# Patient Record
Sex: Male | Born: 1945
Health system: Southern US, Community
[De-identification: ages and names within clinical notes are randomized; demographics above are authoritative.]

## PROBLEM LIST (undated history)

## (undated) DIAGNOSIS — C801 Malignant (primary) neoplasm, unspecified: Secondary | ICD-10-CM

## (undated) DIAGNOSIS — I10 Essential (primary) hypertension: Secondary | ICD-10-CM

## (undated) HISTORY — PX: PROSTATE SURGERY: SHX751

---

## 2013-07-24 DIAGNOSIS — C61 Malignant neoplasm of prostate: Secondary | ICD-10-CM | POA: Insufficient documentation

## 2015-06-25 ENCOUNTER — Emergency Department (INDEPENDENT_AMBULATORY_CARE_PROVIDER_SITE_OTHER)
Admission: EM | Admit: 2015-06-25 | Discharge: 2015-06-25 | Disposition: A | Discharge status: Home / Self Care | Payer: Medicare Other | Source: Home / Self Care | Attending: Family Medicine | Admitting: Family Medicine

## 2015-06-25 DIAGNOSIS — J209 Acute bronchitis, unspecified: Secondary | ICD-10-CM

## 2015-06-25 MED ORDER — IPRATROPIUM-ALBUTEROL 0.5-2.5 (3) MG/3ML IN SOLN
RESPIRATORY_TRACT | Status: AC
  Filled 2015-06-25: qty 3

## 2015-06-25 MED ORDER — IPRATROPIUM-ALBUTEROL 0.5-2.5 (3) MG/3ML IN SOLN
3.0000 mL | Freq: Once | RESPIRATORY_TRACT | Status: AC
  Administered 2015-06-25: 3 mL via RESPIRATORY_TRACT

## 2015-06-25 MED ORDER — ALBUTEROL SULFATE HFA 108 (90 BASE) MCG/ACT IN AERS: 2.0000 | INHALATION_SPRAY | RESPIRATORY_TRACT | Status: DC | PRN

## 2015-06-25 MED ORDER — AZITHROMYCIN 250 MG PO TABS: 250.0000 mg | ORAL_TABLET | Freq: Every day | ORAL | Status: DC

## 2015-06-25 MED ORDER — PREDNISONE 50 MG PO TABS: ORAL_TABLET | ORAL | Status: DC

## 2015-06-25 NOTE — ED Provider Notes (Signed)
CSN: [id number]     Arrival date & time 06/25/15  1851 History   First MD Initiated Contact with Patient 06/25/15 1949     Chief Complaint  Patient presents with  . Cough   (Consider location/radiation/quality/duration/timing/severity/associated sxs/prior Treatment) HPI Cough, wheezing for the last couple of days. Non smoker. OTC meds without relief of symptoms. Relocating from [facility]  No past medical history on file. No past surgical history on file. No family history on file. Social History  Substance Use Topics  . Smoking status: Not on file  . Smokeless tobacco: Not on file  . Alcohol Use: Not on file    Review of Systems ROS +'vecough, wheezing  Denies: HEADACHE, NAUSEA, ABDOMINAL PAIN, CHEST PAIN, CONGESTION, DYSURIA, SHORTNESS OF BREATH  Allergies  Review of patient's allergies indicates no known allergies.  Home Medications   Prior to Admission medications   Medication Sig Start Date End Date Taking? Authorizing Provider  albuterol (PROVENTIL HFA;VENTOLIN HFA) 108 (90 Base) MCG/ACT inhaler Inhale 2 puffs into the lungs every 4 (four) hours as needed for wheezing or shortness of breath. 06/25/15   Konrad Felix, PA  azithromycin (ZITHROMAX) 250 MG tablet Take 1 tablet (250 mg total) by mouth daily. Take first 2 tablets together, then 1 every day until finished. 06/25/15   Konrad Felix, PA  predniSONE (DELTASONE) 50 MG tablet 1 tablet daily for 5 days 06/25/15   Konrad Felix, PA   Meds Ordered and Administered this Visit   Medications  ipratropium-albuterol (DUONEB) 0.5-2.5 (3) MG/3ML nebulizer solution 3 mL (3 mLs Nebulization Given 06/25/15 2023)    BP 195/83 mmHg  Pulse 61  Temp(Src) 98.3 F (36.8 C) (Oral)  Resp 16  SpO2 99% No data found.   Physical Exam  Constitutional: He is oriented to person, place, and time. He appears well-developed and well-nourished.  Eyes: Conjunctivae are normal.  Neck: Normal range of motion. Neck supple.   Pulmonary/Chest: Effort normal. No respiratory distress. He has wheezes. He exhibits no tenderness.  Speaks in full sentences.  No consolidation  Abdominal: Soft.  Musculoskeletal: Normal range of motion.  Neurological: He is oriented to person, place, and time.  Skin: Skin is warm and dry.  Psychiatric: He has a normal mood and affect. His behavior is normal. Judgment and thought content normal.  Nursing note and vitals reviewed.   ED Course  Procedures (including critical care time)  Labs Review Labs Reviewed - No data to display  Imaging Review No results found.   Visual Acuity Review  Right Eye Distance:   Left Eye Distance:   Bilateral Distance:    Right Eye Near:   Left Eye Near:    Bilateral Near:        Reviewed after neb treatment, and pt states his symptoms are much improved.  MDM   1. Bronchitis, acute, with bronchospasm    Patient is advised to continue home symptomatic treatment. Prescription for zpak, albuterol, prednisone  sent pharmacy patient has indicated. Patient is advised that if there are new or worsening symptoms or attend the emergency department, or contact primary care provider. Instructions of care provided discharged home in stable condition.  THIS NOTE WAS GENERATED USING A VOICE RECOGNITION SOFTWARE PROGRAM. ALL REASONABLE EFFORTS  WERE MADE TO PROOFREAD THIS DOCUMENT FOR ACCURACY.     Konrad Felix, PA 06/26/15 1310

## 2015-06-25 NOTE — Discharge Instructions (Signed)

## 2015-06-25 NOTE — ED Notes (Signed)
Patient complains of having a cough with yellow phlegm for the past week Denies any fever

## 2015-07-15 ENCOUNTER — Encounter (HOSPITAL_COMMUNITY): Payer: Self-pay | Admitting: *Deleted

## 2015-07-15 ENCOUNTER — Emergency Department (INDEPENDENT_AMBULATORY_CARE_PROVIDER_SITE_OTHER): Payer: Medicare Other

## 2015-07-15 ENCOUNTER — Encounter (HOSPITAL_COMMUNITY): Payer: Self-pay | Admitting: Emergency Medicine

## 2015-07-15 ENCOUNTER — Emergency Department (HOSPITAL_COMMUNITY)
Admission: EM | Admit: 2015-07-15 | Discharge: 2015-07-15 | Disposition: A | Discharge status: Home / Self Care | Payer: Medicare Other | Attending: Emergency Medicine | Admitting: Emergency Medicine

## 2015-07-15 ENCOUNTER — Emergency Department (INDEPENDENT_AMBULATORY_CARE_PROVIDER_SITE_OTHER)
Admission: EM | Admit: 2015-07-15 | Discharge: 2015-07-15 | Disposition: A | Discharge status: Home / Self Care | Payer: Medicare Other | Source: Home / Self Care | Attending: Family Medicine | Admitting: Family Medicine

## 2015-07-15 DIAGNOSIS — R062 Wheezing: Secondary | ICD-10-CM | POA: Diagnosis not present

## 2015-07-15 DIAGNOSIS — R0602 Shortness of breath: Secondary | ICD-10-CM | POA: Diagnosis not present

## 2015-07-15 DIAGNOSIS — Z79899 Other long term (current) drug therapy: Secondary | ICD-10-CM | POA: Insufficient documentation

## 2015-07-15 DIAGNOSIS — J4521 Mild intermittent asthma with (acute) exacerbation: Secondary | ICD-10-CM

## 2015-07-15 DIAGNOSIS — R05 Cough: Secondary | ICD-10-CM | POA: Insufficient documentation

## 2015-07-15 DIAGNOSIS — R0981 Nasal congestion: Secondary | ICD-10-CM | POA: Diagnosis present

## 2015-07-15 MED ORDER — DEXAMETHASONE SODIUM PHOSPHATE 10 MG/ML IJ SOLN
10.0000 mg | Freq: Once | INTRAMUSCULAR | Status: AC
  Administered 2015-07-15: 10 mg via INTRAMUSCULAR
  Filled 2015-07-15: qty 1

## 2015-07-15 MED ORDER — IPRATROPIUM BROMIDE 0.02 % IN SOLN
0.5000 mg | Freq: Once | RESPIRATORY_TRACT | Status: AC
  Administered 2015-07-15: 0.5 mg via RESPIRATORY_TRACT
  Filled 2015-07-15: qty 2.5

## 2015-07-15 MED ORDER — DOXYCYCLINE HYCLATE 100 MG PO CAPS: 100.0000 mg | ORAL_CAPSULE | Freq: Two times a day (BID) | ORAL | Status: DC

## 2015-07-15 MED ORDER — ALBUTEROL SULFATE (2.5 MG/3ML) 0.083% IN NEBU
5.0000 mg | INHALATION_SOLUTION | Freq: Once | RESPIRATORY_TRACT | Status: AC
  Administered 2015-07-15: 5 mg via RESPIRATORY_TRACT
  Filled 2015-07-15: qty 6

## 2015-07-15 NOTE — ED Notes (Signed)
Given meal bag with cranberry juice.

## 2015-07-15 NOTE — ED Provider Notes (Signed)
CSN: [id number]     Arrival date & time 07/15/15  1422 History   First MD Initiated Contact with Patient 07/15/15 1548     Chief Complaint  Patient presents with  . Follow-up   (Consider location/radiation/quality/duration/timing/severity/associated sxs/prior Treatment) Patient is a 70 y.o. male presenting with cough. The history is provided by the patient.  Cough Cough characteristics:  Productive Sputum characteristics:  White Severity:  Moderate Duration:  4 weeks Timing:  Constant Progression:  Waxing and waning Chronicity:  Recurrent (seen 1/12 with asthma and sx improved briefly after care but relapsed.) Smoker: no   Associated symptoms: shortness of breath and wheezing     History reviewed. No pertinent past medical history. History reviewed. No pertinent past surgical history. History reviewed. No pertinent family history. Social History  Substance Use Topics  . Smoking status: Never Smoker   . Smokeless tobacco: None  . Alcohol Use: No    Review of Systems  Constitutional: Negative.   HENT: Negative.   Respiratory: Positive for cough, shortness of breath and wheezing.   Cardiovascular: Negative.   All other systems reviewed and are negative.   Allergies  Review of patient's allergies indicates no known allergies.  Home Medications   Prior to Admission medications   Medication Sig Start Date End Date Taking? Authorizing Provider  albuterol (PROVENTIL HFA;VENTOLIN HFA) 108 (90 Base) MCG/ACT inhaler Inhale 2 puffs into the lungs every 4 (four) hours as needed for wheezing or shortness of breath. 06/25/15   Konrad Felix, PA  azithromycin (ZITHROMAX) 250 MG tablet Take 1 tablet (250 mg total) by mouth daily. Take first 2 tablets together, then 1 every day until finished. 06/25/15   Konrad Felix, PA  predniSONE (DELTASONE) 50 MG tablet 1 tablet daily for 5 days 06/25/15   Konrad Felix, PA   Meds Ordered and Administered this Visit  Medications - No data to  display  BP 187/76 mmHg  Pulse 60  Temp(Src) 98.2 F (36.8 C) (Oral)  Resp 16  SpO2 96% No data found.   Physical Exam  Constitutional: He is oriented to person, place, and time. He appears well-developed and well-nourished. No distress.  HENT:  Mouth/Throat: Oropharynx is clear and moist.  Neck: Normal range of motion. Neck supple.  Cardiovascular: Normal rate, regular rhythm, normal heart sounds and intact distal pulses.   Pulmonary/Chest: Effort normal. He has wheezes.  Lymphadenopathy:    He has no cervical adenopathy.  Neurological: He is alert and oriented to person, place, and time.  Skin: Skin is warm and dry.  Nursing note and vitals reviewed.   ED Course  Procedures (including critical care time)  Labs Review Labs Reviewed - No data to display  Imaging Review Dg Chest 2 View  07/15/2015  CLINICAL DATA:  Shortness of breath and wheezing EXAM: CHEST  2 VIEW COMPARISON:  None. FINDINGS: Top-normal heart size. Mildly tortuous thoracic aorta. Otherwise normal mediastinal contour. No pneumothorax. No pleural effusion. Lungs appear clear, with no acute consolidative airspace disease and no pulmonary edema. IMPRESSION: No active cardiopulmonary disease. Electronically Signed   By: Ilona Sorrel M.D.   On: 07/15/2015 16:28   X-rays reviewed and report per radiologist.   Visual Acuity Review  Right Eye Distance:   Left Eye Distance:   Bilateral Distance:    Right Eye Near:   Left Eye Near:    Bilateral Near:         MDM   1. Asthma exacerbation attacks, mild intermittent  Sent for recurrent asthma eval., , seen 1/12 and treated with temp relief but now wheezing with doe.    Billy Fischer, MD 07/15/15 [phone]

## 2015-07-15 NOTE — ED Notes (Signed)
Pt  Was  Seen  2 1/2  Weeks  Ago  For  Bronchitis           Took  meds     Still  Having         Symptoms    Cough   And   Congestion           And  Tightness  In  Chest           With   Wheezing  Noted

## 2015-07-15 NOTE — Discharge Instructions (Signed)

## 2015-07-15 NOTE — ED Notes (Signed)
pefr    450

## 2015-07-15 NOTE — ED Notes (Signed)
Security called to transport pt back to car at [facility]

## 2015-07-15 NOTE — ED Notes (Signed)
Pt from home for follow up on congestion and cough with yellow sputum, states was seen at [facility] for same and given zpack but reports after he finished it didn't help. Denies any n/v/d or fevers. No respiratory distress noted at this time.

## 2015-07-15 NOTE — ED Notes (Signed)
Pt c/o congestion and wheezing. Was seen on 1/12 at [facility], given inhaler, and antibiotics. Reports improvement with antibiotics, has not been using inhaler. Wheezing noted bilateral

## 2015-07-19 NOTE — ED Provider Notes (Signed)
CSN: [id number]     Arrival date & time 07/15/15  1719 History   First MD Initiated Contact with Patient 07/15/15 1908     Chief Complaint  Patient presents with  . Nasal Congestion      HPI Pt from home for follow up on congestion and cough with yellow sputum, states was seen at [facility] for same and given zpack but reports after he finished it didn't help. Denies any n/v/d or fevers. No respiratory distress noted at this time.  History reviewed. No pertinent past medical history. History reviewed. No pertinent past surgical history. No family history on file. Social History  Substance Use Topics  . Smoking status: Never Smoker   . Smokeless tobacco: None  . Alcohol Use: No    Review of Systems  All other systems reviewed and are negative.     Allergies  Review of patient's allergies indicates no known allergies.  Home Medications   Prior to Admission medications   Medication Sig Start Date End Date Taking? Authorizing Provider  albuterol (PROVENTIL HFA;VENTOLIN HFA) 108 (90 Base) MCG/ACT inhaler Inhale 2 puffs into the lungs every 4 (four) hours as needed for wheezing or shortness of breath. 06/25/15  Yes Konrad Felix, PA  metoprolol (TOPROL-XL) 200 MG 24 hr tablet Take 200 mg by mouth daily.   Yes Historical Provider, MD  valsartan (DIOVAN) 320 MG tablet Take 320 mg by mouth daily.   Yes Historical Provider, MD  Verapamil HCl CR 200 MG CP24 Take 1 capsule by mouth daily.   Yes Historical Provider, MD  doxycycline (VIBRAMYCIN) 100 MG capsule Take 1 capsule (100 mg total) by mouth 2 (two) times daily. 07/15/15   Leonard Schwartz, MD  predniSONE (DELTASONE) 50 MG tablet 1 tablet daily for 5 days Patient not taking: Reported on 07/15/2015 06/25/15   Konrad Felix, PA   BP 162/83 mmHg  Pulse 62  Temp(Src) 98.1 F (36.7 C) (Oral)  Resp 18  SpO2 98% Physical Exam  Constitutional: He is oriented to person, place, and time. He appears well-developed and well-nourished. No distress.   HENT:  Head: Normocephalic and atraumatic.  Eyes: Pupils are equal, round, and reactive to light.  Neck: Normal range of motion.  Cardiovascular: Normal rate and intact distal pulses.   Pulmonary/Chest: No respiratory distress. He has wheezes.  Abdominal: Normal appearance. He exhibits no distension. There is no tenderness. There is no rebound.  Musculoskeletal: Normal range of motion.  Neurological: He is alert and oriented to person, place, and time. No cranial nerve deficit.  Skin: Skin is warm and dry. No rash noted.  Psychiatric: He has a normal mood and affect. His behavior is normal.  Nursing note and vitals reviewed.   ED Course  Procedures (including critical care time) Medications  albuterol (PROVENTIL) (2.5 MG/3ML) 0.083% nebulizer solution 5 mg (5 mg Nebulization Given 07/15/15 2003)  ipratropium (ATROVENT) nebulizer solution 0.5 mg (0.5 mg Nebulization Given 07/15/15 2003)  dexamethasone (DECADRON) injection 10 mg (10 mg Intramuscular Given 07/15/15 2107)     No results found for this or any previous visit. Dg Chest 2 View  07/15/2015  CLINICAL DATA:  Shortness of breath and wheezing EXAM: CHEST  2 VIEW COMPARISON:  None. FINDINGS: Top-normal heart size. Mildly tortuous thoracic aorta. Otherwise normal mediastinal contour. No pneumothorax. No pleural effusion. Lungs appear clear, with no acute consolidative airspace disease and no pulmonary edema. IMPRESSION: No active cardiopulmonary disease. Electronically Signed   By: Janina Mayo.D.  On: 07/15/2015 16:28      After treatment in the ED the patient feels back to baseline and wants to go home.  MDM   Final diagnoses:  Wheezing        Leonard Schwartz, MD 07/19/15 1036

## 2015-10-16 DIAGNOSIS — H11153 Pinguecula, bilateral: Secondary | ICD-10-CM | POA: Diagnosis not present

## 2015-10-16 DIAGNOSIS — H2511 Age-related nuclear cataract, right eye: Secondary | ICD-10-CM | POA: Diagnosis not present

## 2015-10-16 DIAGNOSIS — H43812 Vitreous degeneration, left eye: Secondary | ICD-10-CM | POA: Diagnosis not present

## 2015-11-19 DIAGNOSIS — Z8546 Personal history of malignant neoplasm of prostate: Secondary | ICD-10-CM | POA: Diagnosis not present

## 2015-11-19 DIAGNOSIS — I10 Essential (primary) hypertension: Secondary | ICD-10-CM | POA: Diagnosis not present

## 2015-11-19 DIAGNOSIS — Z23 Encounter for immunization: Secondary | ICD-10-CM | POA: Diagnosis not present

## 2015-11-19 DIAGNOSIS — Z0000 Encounter for general adult medical examination without abnormal findings: Secondary | ICD-10-CM | POA: Diagnosis not present

## 2015-11-19 DIAGNOSIS — Z125 Encounter for screening for malignant neoplasm of prostate: Secondary | ICD-10-CM | POA: Diagnosis not present

## 2015-11-19 DIAGNOSIS — Z136 Encounter for screening for cardiovascular disorders: Secondary | ICD-10-CM | POA: Diagnosis not present

## 2015-11-19 DIAGNOSIS — Z1211 Encounter for screening for malignant neoplasm of colon: Secondary | ICD-10-CM | POA: Diagnosis not present

## 2015-12-07 DIAGNOSIS — D72819 Decreased white blood cell count, unspecified: Secondary | ICD-10-CM | POA: Diagnosis not present

## 2016-08-02 DIAGNOSIS — Z86010 Personal history of colonic polyps: Secondary | ICD-10-CM | POA: Diagnosis not present

## 2017-03-29 IMAGING — DX DG CHEST 2V
2 series · 2 of 2 positions shown · non-contrast
Comparison: None.

CLINICAL DATA: Shortness of breath and wheezing

EXAM:
CHEST  2 VIEW

[chest pa]
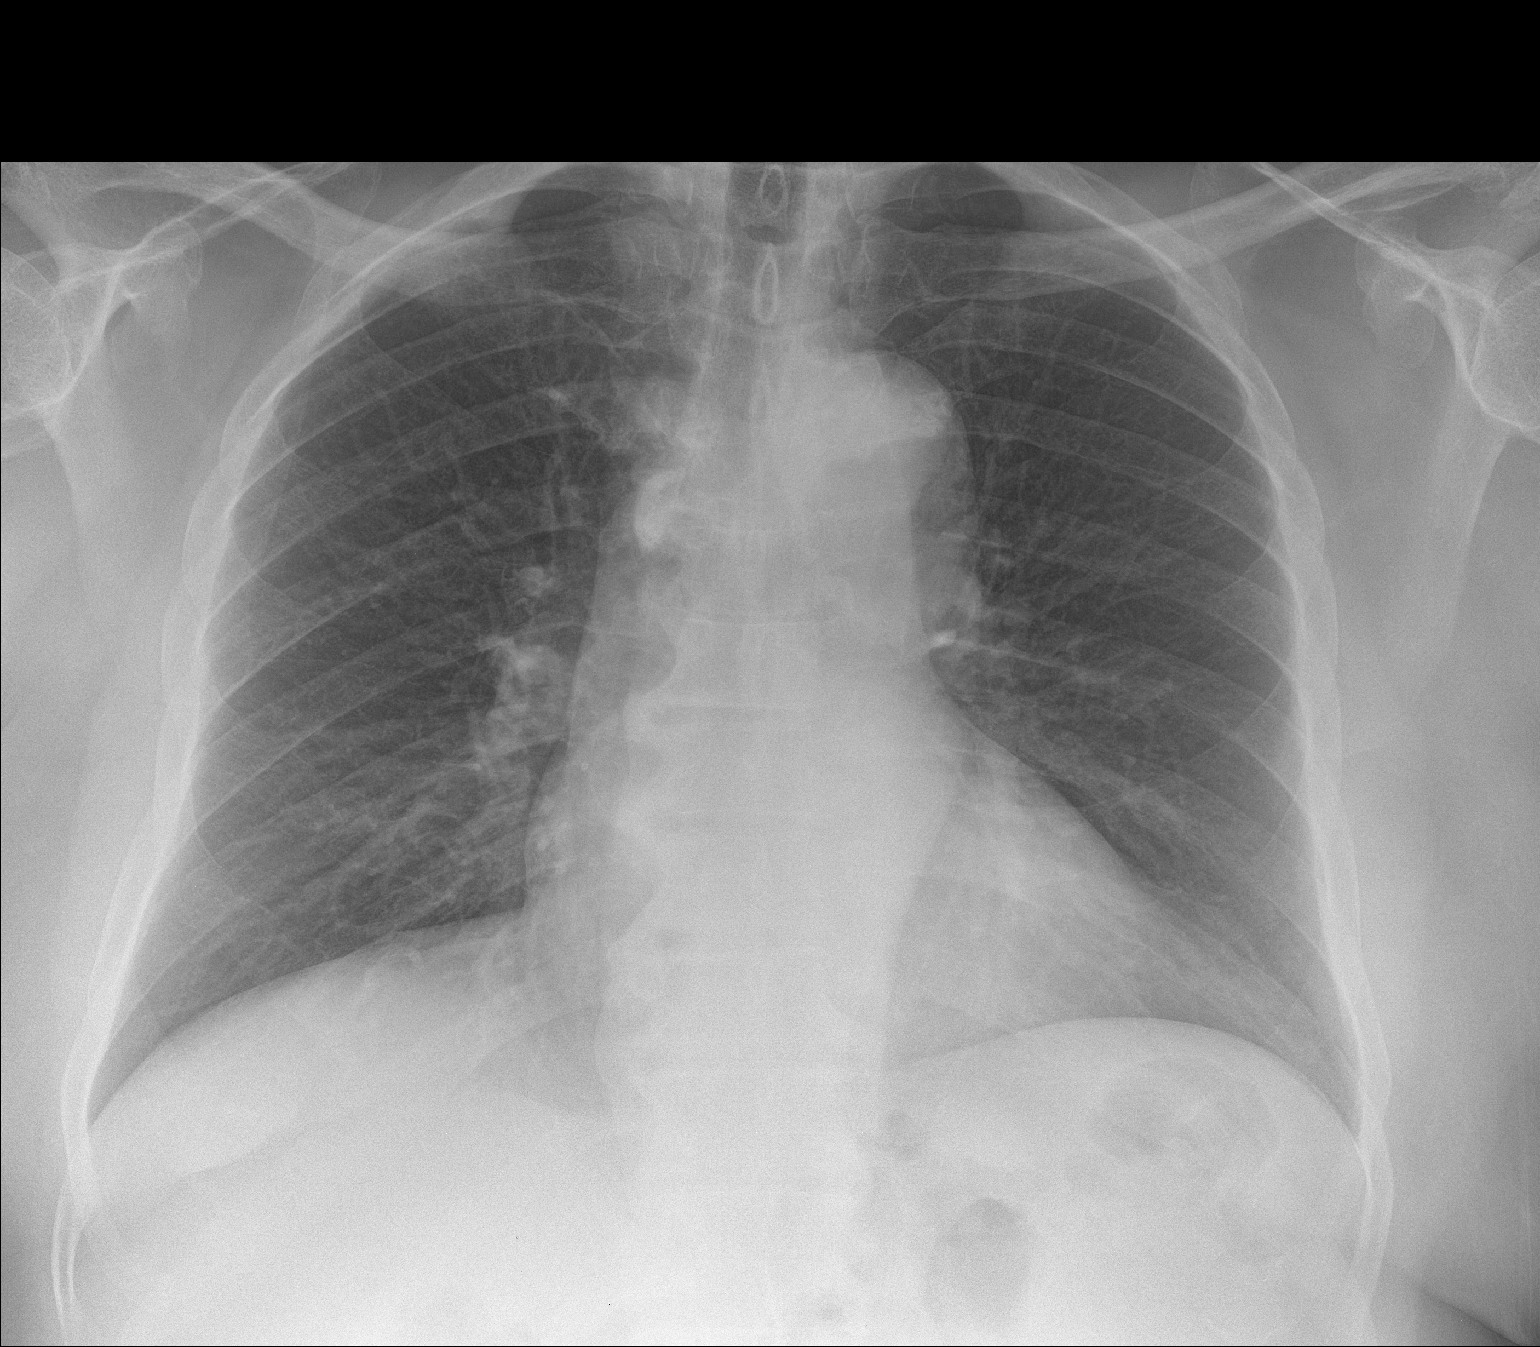

[chest lat]
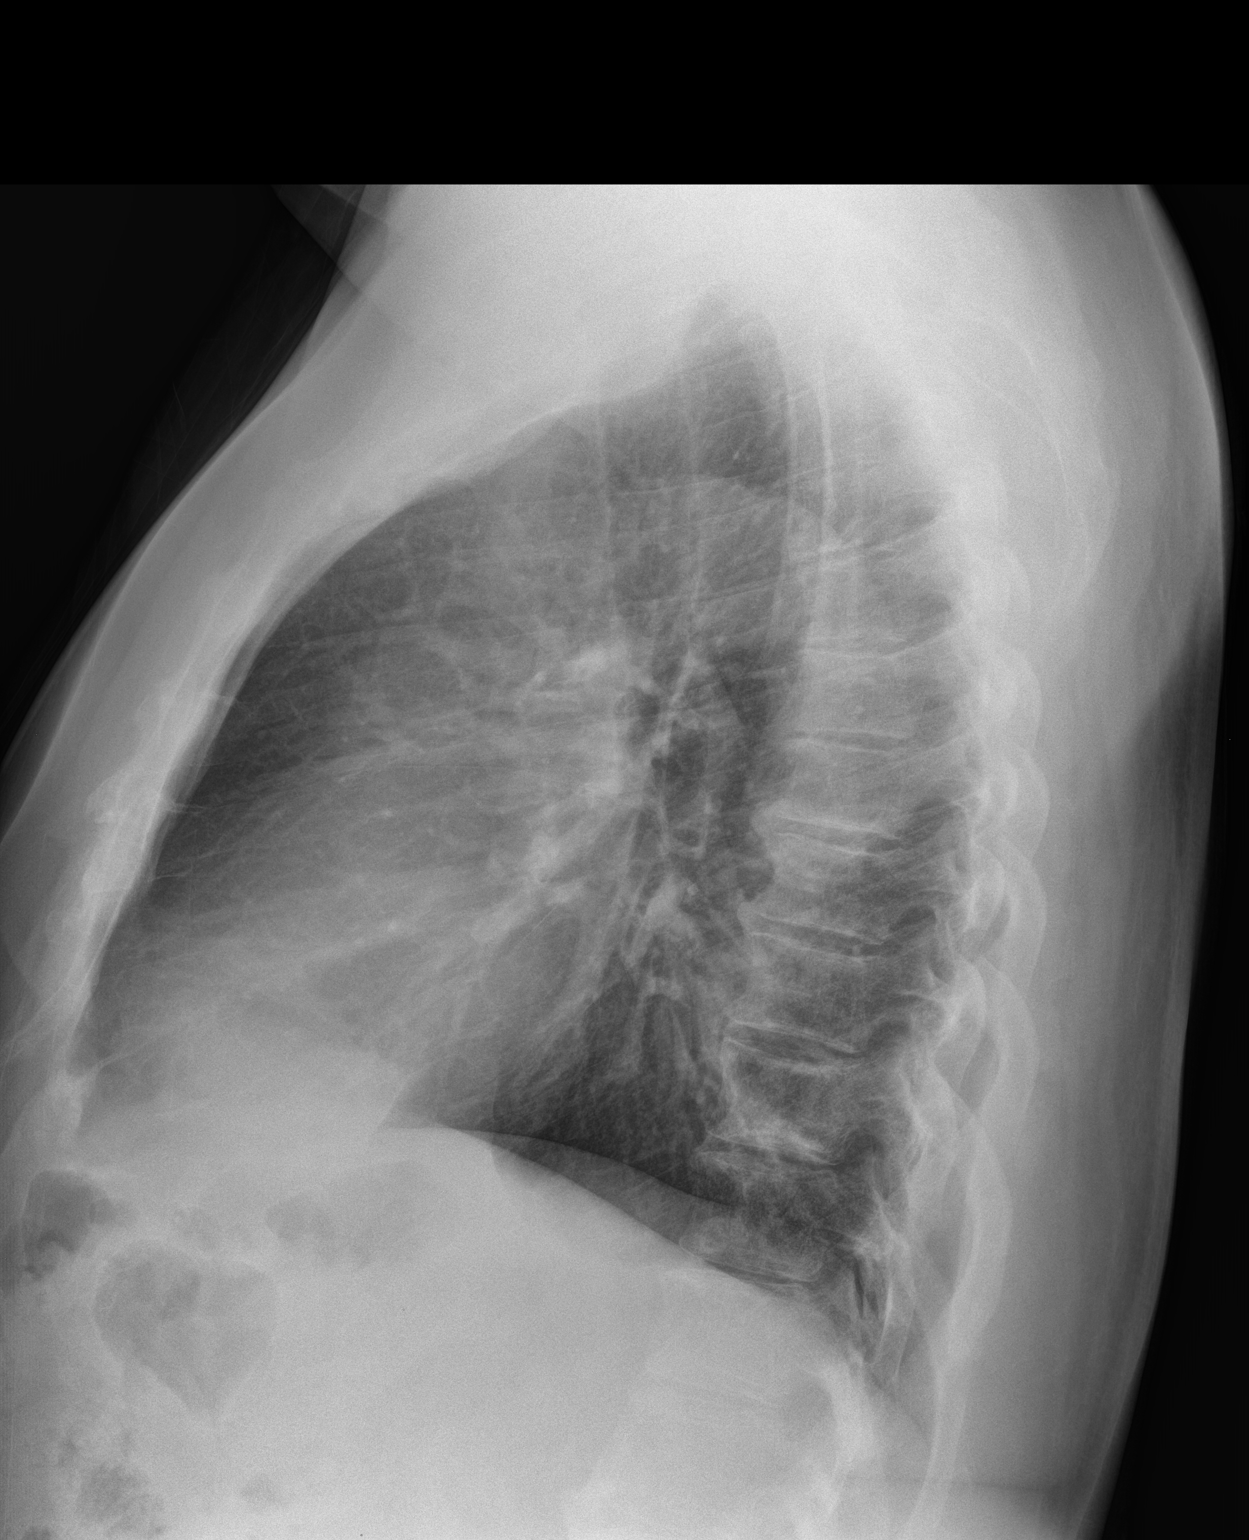

[2 of 2 positions shown; findings below may reference images not displayed]

FINDINGS: Top-normal heart size. Mildly tortuous thoracic aorta. Otherwise
normal mediastinal contour. No pneumothorax. No pleural effusion.
Lungs appear clear, with no acute consolidative airspace disease and
no pulmonary edema.
IMPRESSION: No active cardiopulmonary disease.

## 2018-01-23 ENCOUNTER — Ambulatory Visit (INDEPENDENT_AMBULATORY_CARE_PROVIDER_SITE_OTHER): Payer: Medicare Other | Admitting: Podiatry

## 2018-01-23 ENCOUNTER — Ambulatory Visit (INDEPENDENT_AMBULATORY_CARE_PROVIDER_SITE_OTHER): Payer: Medicare Other

## 2018-01-23 ENCOUNTER — Encounter: Payer: Self-pay | Admitting: Podiatry

## 2018-01-23 DIAGNOSIS — M722 Plantar fascial fibromatosis: Secondary | ICD-10-CM

## 2018-01-23 DIAGNOSIS — B351 Tinea unguium: Secondary | ICD-10-CM | POA: Diagnosis not present

## 2018-01-23 MED ORDER — MELOXICAM 15 MG PO TABS: 15.0000 mg | ORAL_TABLET | Freq: Every day | ORAL | 0 refills | Status: DC

## 2018-01-23 NOTE — Progress Notes (Signed)
   Subjective:    Patient ID: Jordan Ramos, male    DOB: 23-Dec-1945, 72 y.o.   MRN: [id number]  HPI 72 year old male presents the office today for concerns of pain to both of his feet and heel pain.  He states that he lays walking started to have some pain to his knees and he continues to have some orthotics.  He denies any recent injury or trauma to his feet and this is been ongoing for greater than 1 year.  He denies any swelling or redness.  No numbness or tingling.  The pain does not wake him up at night.  He also states her left big toenail becoming thickened discolored he is asked about any treatments.  Denies any pain or signs of infection.  He has no other concerns.  Review of Systems  All other systems reviewed and are negative.  History reviewed. No pertinent past medical history.  History reviewed. No pertinent surgical history.   Current Outpatient Medications:  .  AMLODIPINE BESYLATE PO, Take by mouth., Disp: , Rfl:  .  albuterol (PROVENTIL HFA;VENTOLIN HFA) 108 (90 Base) MCG/ACT inhaler, Inhale 2 puffs into the lungs every 4 (four) hours as needed for wheezing or shortness of breath., Disp: 1 Inhaler, Rfl: 0 .  meloxicam (MOBIC) 15 MG tablet, Take 1 tablet (15 mg total) by mouth daily., Disp: 30 tablet, Rfl: 0 .  Verapamil HCl CR 200 MG CP24, Take 1 capsule by mouth daily., Disp: , Rfl:   No Known Allergies       Objective:   Physical Exam  General: AAO x3, NAD  Dermatological: Nails appear to be hypertrophic and dystrophic ill-defined discoloration of the left hallux toenail with any pain.  There is no redness or drainage or any signs of infection. Vascular: Dorsalis Pedis artery and Posterior Tibial artery pedal pulses are 2/4 bilateral with immedate capillary fill time.  Mild ankle edema bilaterally without any erythema.  No pain.  There is no pain with calf compression, swelling, warmth, erythema.   Neruologic: Grossly intact via light touch bilateral.Protective  threshold with Semmes Wienstein monofilament intact to all pedal sites bilateral.  Negative Tinel sign.  Musculoskeletal: There is mild tenderness palpation on the plantar medial tubercle of the calcaneus at the insertion of plantar fascial bilaterally.  There is no pain on the course of plantar fashion the arch of the foot.  Achilles tendon appears to be intact but any pain.  No edema, erythema, increase in warmth.  Range of motion appears to be intact.  Decreased medial arch height.  Muscular strength 5/5 in all groups tested bilateral.     Assessment & Plan:  72 year old male with heel pain, likely plantar fasciitis; onychomycosis -Treatment options discussed including all alternatives, risks, and complications -Etiology of symptoms were discussed -X-rays were obtained and reviewed with the patient.  There is no evidence of acute fracture or stress fracture identified today. -Discussed stretching, icing exercises daily.  Prescribed meloxicam discussed side effects medication.  Discussed shoe modifications and orthotics.  A prescription for follow-up as written for the [facility] and this was given to [facility].  -Discussed treatment options for nail thickening and fungus.  After discussion he wants to do a topical treatment.  I ordered a topical ointment through [facility] for onychomycosis and to help thin the toenail as well.   Trula Slade DPM

## 2018-01-24 DIAGNOSIS — M722 Plantar fascial fibromatosis: Secondary | ICD-10-CM | POA: Insufficient documentation

## 2018-01-24 NOTE — Patient Instructions (Signed)

## 2018-07-04 ENCOUNTER — Telehealth: Payer: Self-pay | Admitting: Podiatry

## 2018-07-04 NOTE — Telephone Encounter (Signed)
Robin with [facility] needs office visit notes from date of service 23 January 2018. Fax number [phone].

## 2020-01-17 ENCOUNTER — Other Ambulatory Visit: Payer: Self-pay

## 2020-01-17 ENCOUNTER — Ambulatory Visit: Payer: Medicare Other

## 2020-01-17 DIAGNOSIS — Z20822 Contact with and (suspected) exposure to covid-19: Secondary | ICD-10-CM

## 2020-01-18 LAB — NOVEL CORONAVIRUS, NAA: SARS-CoV-2, NAA: NOT DETECTED

## 2020-01-18 LAB — SARS-COV-2, NAA 2 DAY TAT

## 2021-12-21 ENCOUNTER — Encounter (HOSPITAL_COMMUNITY): Payer: Self-pay | Admitting: Emergency Medicine

## 2021-12-21 ENCOUNTER — Other Ambulatory Visit: Payer: Self-pay

## 2021-12-21 ENCOUNTER — Ambulatory Visit (HOSPITAL_COMMUNITY)
Admission: EM | Admit: 2021-12-21 | Discharge: 2021-12-21 | Disposition: A | Discharge status: Home / Self Care | Payer: Medicare Other

## 2021-12-21 DIAGNOSIS — S46811A Strain of other muscles, fascia and tendons at shoulder and upper arm level, right arm, initial encounter: Secondary | ICD-10-CM

## 2021-12-21 DIAGNOSIS — M545 Low back pain, unspecified: Secondary | ICD-10-CM | POA: Diagnosis not present

## 2021-12-21 DIAGNOSIS — M542 Cervicalgia: Secondary | ICD-10-CM

## 2021-12-21 HISTORY — DX: Malignant (primary) neoplasm, unspecified: C80.1

## 2021-12-21 HISTORY — DX: Essential (primary) hypertension: I10
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
## 2021-12-21 NOTE — Discharge Instructions (Addendum)
Recommend apply warm compresses to area to help with pain and muscle stiffness. Also recommend OTC Voltaren (Diclofenac) gel - use twice a day and apply to affected area as needed. Follow-up with the Chiropractor as recommended. If pain gets worse, return for recheck or go to your PCP for further evaluation.
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
